# Patient Record
Sex: Male | Born: 1985 | Race: White | Hispanic: No | Marital: Single
Health system: Southern US, Community
[De-identification: ages and names within clinical notes are randomized; demographics above are authoritative.]

## PROBLEM LIST (undated history)

## (undated) DIAGNOSIS — K219 Gastro-esophageal reflux disease without esophagitis: Secondary | ICD-10-CM

## (undated) DIAGNOSIS — J3501 Chronic tonsillitis: Secondary | ICD-10-CM

## (undated) HISTORY — DX: Chronic tonsillitis: J35.01

## (undated) HISTORY — DX: Gastro-esophageal reflux disease without esophagitis: K21.9

---

## 2018-11-10 ENCOUNTER — Telehealth: Payer: Self-pay | Admitting: Gastroenterology

## 2018-11-10 NOTE — Telephone Encounter (Signed)
Patient has been scheduled for 11-15-18 @ 11:00am but would like to be seen in the office if he could. He is a new patient and the referral has been faxed over to [facility] office.

## 2018-11-10 NOTE — Telephone Encounter (Signed)
I have spoke with patient he was concerned with how much he would pay for a virtual visit. Patient spoke with Erie Noe about his insurance and agreed to do his virtual visit.

## 2018-11-15 ENCOUNTER — Telehealth: Payer: BLUE CROSS/BLUE SHIELD | Admitting: Gastroenterology

## 2019-06-07 ENCOUNTER — Other Ambulatory Visit: Payer: Self-pay

## 2019-06-07 ENCOUNTER — Encounter (HOSPITAL_COMMUNITY): Payer: Self-pay

## 2019-06-07 ENCOUNTER — Emergency Department (HOSPITAL_COMMUNITY)
Admission: EM | Admit: 2019-06-07 | Discharge: 2019-06-07 | Discharge status: Against Medical Advice | Payer: BC Managed Care – PPO | Attending: Emergency Medicine | Admitting: Emergency Medicine

## 2019-06-07 ENCOUNTER — Emergency Department (HOSPITAL_COMMUNITY): Payer: BC Managed Care – PPO

## 2019-06-07 DIAGNOSIS — Z5321 Procedure and treatment not carried out due to patient leaving prior to being seen by health care provider: Secondary | ICD-10-CM | POA: Diagnosis not present

## 2019-06-07 DIAGNOSIS — R0789 Other chest pain: Secondary | ICD-10-CM | POA: Diagnosis present

## 2019-06-07 LAB — CBC
HCT: 41.6 % (ref 39.0–52.0)
Hemoglobin: 14.4 g/dL (ref 13.0–17.0)
MCH: 30.9 pg (ref 26.0–34.0)
MCHC: 34.6 g/dL (ref 30.0–36.0)
MCV: 89.3 fL (ref 80.0–100.0)
Platelets: 255 10*3/uL (ref 150–400)
RBC: 4.66 MIL/uL (ref 4.22–5.81)
RDW: 12 % (ref 11.5–15.5)
WBC: 4.3 10*3/uL (ref 4.0–10.5)
nRBC: 0 % (ref 0.0–0.2)

## 2019-06-07 LAB — BASIC METABOLIC PANEL
Anion gap: 12 (ref 5–15)
BUN: 11 mg/dL (ref 6–20)
CO2: 25 mmol/L (ref 22–32)
Calcium: 8.9 mg/dL (ref 8.9–10.3)
Chloride: 101 mmol/L (ref 98–111)
Creatinine, Ser: 0.84 mg/dL (ref 0.61–1.24)
GFR calc Af Amer: >60 mL/min (ref >60)
GFR calc non Af Amer: >60 mL/min (ref >60)
Glucose, Bld: 103 mg/dL — ABNORMAL HIGH (ref 70–99)
Potassium: 3.9 mmol/L (ref 3.5–5.1)
Sodium: 138 mmol/L (ref 135–145)

## 2019-06-07 LAB — TROPONIN I (HIGH SENSITIVITY): Troponin I (High Sensitivity): <2 ng/L (ref <18)

## 2019-06-07 NOTE — ED Notes (Signed)
Pt states that he is leaving.  

## 2019-06-07 NOTE — ED Triage Notes (Signed)
Pt reports worsening chest pain and shortness of breath. Pt tested positive for COVID last week. resp e.u

## 2021-05-25 IMAGING — DX DG CHEST 1V PORT
1 series · 1 of 1 positions shown · non-contrast
Comparison: September 19, 2013

CLINICAL DATA: COVID positive

EXAM:
PORTABLE CHEST 1 VIEW

[chest ap]
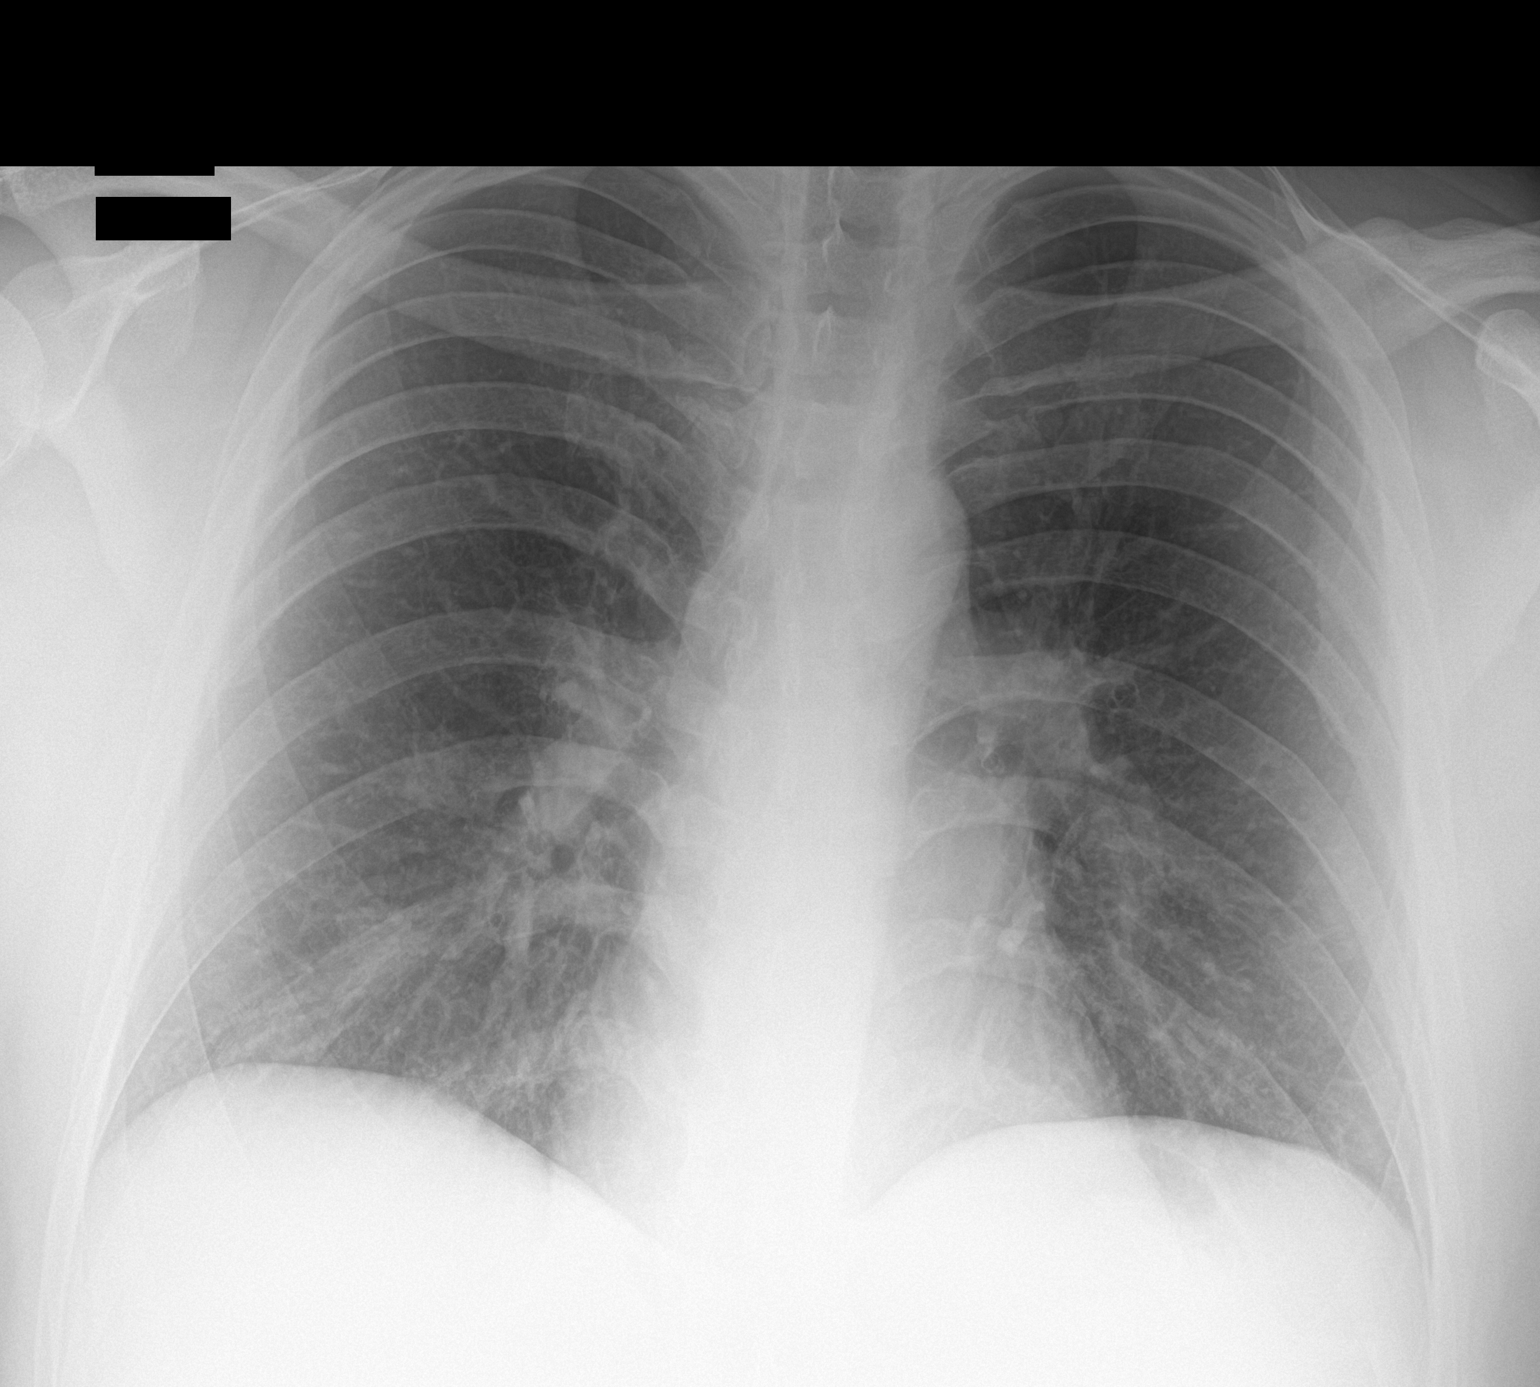

[1 of 1 positions shown; findings below may reference images not displayed]

FINDINGS: The heart size and mediastinal contours are within normal limits.
There is mildly increased hazy airspace opacity seen at the right
base. The visualized skeletal structures are unremarkable.
IMPRESSION: Mildly increased hazy airspace opacity at the right lung base which
could be due to atelectasis and/or early infectious etiology.

## 2022-05-30 ENCOUNTER — Emergency Department (HOSPITAL_COMMUNITY)
Admission: EM | Admit: 2022-05-30 | Discharge: 2022-05-30 | Discharge status: Against Medical Advice | Payer: BC Managed Care – PPO

## 2022-05-30 NOTE — ED Notes (Addendum)
Pt decided to refuse treatment and sign out AMA. He said when he had a reaction in the past he would breakout into hives and he had not done that this time and he was not having trouble breathing. He decided to go home and take benadryl. I explained he could always come back or call 911 if anything changed. Triage nurse notified.
